# Patient Record
Sex: Female | Born: 1995 | Race: Black or African American | Hispanic: No | Marital: Single | Smoking: Never smoker
Health system: Southern US, Community
[De-identification: ages and names within clinical notes are randomized; demographics above are authoritative.]

## PROBLEM LIST (undated history)

## (undated) DIAGNOSIS — Z789 Other specified health status: Secondary | ICD-10-CM

## (undated) HISTORY — PX: NO PAST SURGERIES: SHX2092

---

## 2018-10-11 ENCOUNTER — Other Ambulatory Visit: Payer: Self-pay

## 2018-10-11 ENCOUNTER — Emergency Department (HOSPITAL_COMMUNITY): Payer: Self-pay

## 2018-10-11 ENCOUNTER — Emergency Department (HOSPITAL_COMMUNITY)
Admission: EM | Admit: 2018-10-11 | Discharge: 2018-10-11 | Disposition: A | Discharge status: Home / Self Care | Payer: Self-pay | Attending: Emergency Medicine | Admitting: Emergency Medicine

## 2018-10-11 DIAGNOSIS — M25512 Pain in left shoulder: Secondary | ICD-10-CM | POA: Insufficient documentation

## 2018-10-11 DIAGNOSIS — S9032XA Contusion of left foot, initial encounter: Secondary | ICD-10-CM

## 2018-10-11 DIAGNOSIS — M79672 Pain in left foot: Secondary | ICD-10-CM | POA: Insufficient documentation

## 2018-10-11 DIAGNOSIS — M25511 Pain in right shoulder: Secondary | ICD-10-CM | POA: Insufficient documentation

## 2018-10-11 MED ORDER — IBUPROFEN 800 MG PO TABS: 800.0000 mg | ORAL_TABLET | Freq: Three times a day (TID) | ORAL | 0 refills | Status: DC
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
## 2018-10-11 NOTE — ED Notes (Signed)
ED Provider at bedside. 
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
## 2018-10-11 NOTE — ED Notes (Signed)
Patient transported to X-ray 

## 2018-10-11 NOTE — ED Notes (Signed)
Patient verbalizes understanding of discharge instructions. Opportunity for questioning and answers were provided. Armband removed by staff, pt discharged from ED. Pt ambulatory to lobby.  

## 2018-10-11 NOTE — Discharge Instructions (Signed)
It was my pleasure taking care of you today!   Ibuprofen as needed for pain.  Rest / ice / elevate the foot to help with pain and swelling.   If your pain is not improving in 1 week, please call the orthopedist listed to schedule a follow up appointment.   Return to ER for new or worsening symptoms, any additional concerns.

## 2019-04-25 ENCOUNTER — Other Ambulatory Visit: Payer: Self-pay

## 2019-04-25 ENCOUNTER — Ambulatory Visit (HOSPITAL_COMMUNITY)
Admission: EM | Admit: 2019-04-25 | Discharge: 2019-04-25 | Disposition: A | Discharge status: Home / Self Care | Payer: BC Managed Care – PPO | Attending: Urgent Care | Admitting: Urgent Care

## 2019-04-25 ENCOUNTER — Encounter (HOSPITAL_COMMUNITY): Payer: Self-pay

## 2019-04-25 DIAGNOSIS — N760 Acute vaginitis: Secondary | ICD-10-CM | POA: Diagnosis not present

## 2019-04-25 DIAGNOSIS — N898 Other specified noninflammatory disorders of vagina: Secondary | ICD-10-CM | POA: Insufficient documentation

## 2019-04-25 MED ORDER — FLUCONAZOLE 150 MG PO TABS: 150.0000 mg | ORAL_TABLET | ORAL | 0 refills | Status: DC

## 2019-04-25 NOTE — ED Triage Notes (Signed)
Patient presents to Urgent Care with complaints of vaginal irritation since being treated for a yeast infection. Patient reports she thinks she may have developed BV, denies vaginal discharge.

## 2019-04-25 NOTE — ED Provider Notes (Signed)
  MRN: [id number] DOB: 01-19-1996  Subjective:   Tracy Browning is a 23 y.o. female presenting for 2 week hx of intermittent vaginal irritation/discharge. She thought it was a yeast infection and treated her sx with Monistat. It cleared up but after having her cycle (which ended 4 days ago) started having irritation. Does not have discharge. She did take 1 pill of amoxicillin from a left over script and did not notice any significant changes from this.  She is not currently taking any medications and has no known food or drug allergies.  Denies past medical and surgical history.  Review of Systems  Constitutional: Negative for chills, fever and malaise/fatigue.  Respiratory: Negative for cough and shortness of breath.   Cardiovascular: Negative for chest pain.  Gastrointestinal: Negative for abdominal pain, constipation, diarrhea, nausea and vomiting.  Genitourinary: Negative for dysuria, flank pain, frequency, hematuria and urgency.  Musculoskeletal: Negative for back pain and myalgias.  Skin: Negative for rash.  Neurological: Negative for dizziness and headaches.  Psychiatric/Behavioral: Negative for substance abuse.    Objective:   Vitals: BP 101/75 (BP Location: Left Arm)   Pulse 74   Temp 98.1 F (36.7 C) (Oral)   Resp 18   LMP 04/17/2019 (Exact Date)   SpO2 100%   Physical Exam Constitutional:      General: She is not in acute distress.    Appearance: Normal appearance. She is well-developed. She is not ill-appearing.  HENT:     Head: Normocephalic and atraumatic.     Nose: Nose normal.     Mouth/Throat:     Mouth: Mucous membranes are moist.     Pharynx: Oropharynx is clear.  Eyes:     General: No scleral icterus.    Extraocular Movements: Extraocular movements intact.     Pupils: Pupils are equal, round, and reactive to light.  Cardiovascular:     Rate and Rhythm: Normal rate.  Pulmonary:     Effort: Pulmonary effort is normal.  Skin:    General: Skin is warm and  dry.  Neurological:     General: No focal deficit present.     Mental Status: She is alert and oriented to person, place, and time.  Psychiatric:        Mood and Affect: Mood normal.        Behavior: Behavior normal.     Assessment and Plan :   1. Acute vaginitis   2. Vaginal irritation    We will treat empirically for yeast vaginitis with Diflucan, labs pending. Counseled patient on potential for adverse effects with medications prescribed/recommended today, ER and return-to-clinic precautions discussed, patient verbalized understanding.    Jaynee Eagles, PA-C 04/25/19 1214

## 2019-04-26 LAB — CERVICOVAGINAL ANCILLARY ONLY
Bacterial vaginitis: POSITIVE — AB
Candida vaginitis: NEGATIVE
Chlamydia: NEGATIVE
Neisseria Gonorrhea: NEGATIVE
Trichomonas: POSITIVE — AB

## 2019-04-27 ENCOUNTER — Telehealth (HOSPITAL_COMMUNITY): Payer: Self-pay | Admitting: Emergency Medicine

## 2019-04-27 MED ORDER — METRONIDAZOLE 500 MG PO TABS: 500.0000 mg | ORAL_TABLET | Freq: Two times a day (BID) | ORAL | 0 refills | Status: AC

## 2019-04-27 NOTE — Addendum Note (Signed)
Addended by: Sandria Manly on: 04/27/2019 02:47 PM   Modules accepted: Orders

## 2019-04-27 NOTE — Telephone Encounter (Signed)
Bacterial vaginosis is positive. This was not treated at the urgent care visit.  Flagyl 500 mg BID x 7 days #14 no refills sent to patients pharmacy of choice.    Patient contacted and made aware of all results, all questions answered.  Trichomonas is positive. Rx  for Flagyl, was sent to the pharmacy of record. Pt needs education to refrain from sexual intercourse for 7 days to give the medicine time to work. Sexual partners need to be notified and tested/treated. Condoms may reduce risk of reinfection. Recheck for further evaluation if symptoms are not improving.

## 2019-05-24 ENCOUNTER — Other Ambulatory Visit: Payer: Self-pay

## 2019-05-24 ENCOUNTER — Ambulatory Visit (HOSPITAL_COMMUNITY)
Admission: EM | Admit: 2019-05-24 | Discharge: 2019-05-24 | Disposition: A | Discharge status: Home / Self Care | Payer: BC Managed Care – PPO | Attending: Internal Medicine | Admitting: Internal Medicine

## 2019-05-24 ENCOUNTER — Encounter (HOSPITAL_COMMUNITY): Payer: Self-pay | Admitting: Emergency Medicine

## 2019-05-24 DIAGNOSIS — B373 Candidiasis of vulva and vagina: Secondary | ICD-10-CM

## 2019-05-24 DIAGNOSIS — B3731 Acute candidiasis of vulva and vagina: Secondary | ICD-10-CM

## 2019-05-24 MED ORDER — FLUCONAZOLE 150 MG PO TABS: 150.0000 mg | ORAL_TABLET | ORAL | 0 refills | Status: DC

## 2019-05-24 NOTE — ED Triage Notes (Signed)
PT reports vaginal discharge and itching that started today.

## 2019-05-24 NOTE — ED Provider Notes (Signed)
[facility]    CSN: [id number] Arrival date & time: 05/24/19  1929     History   Chief Complaint Chief Complaint  Patient presents with  . Vaginal Discharge    HPI Tracy Browning is a 23 y.o. female comes to urgent care with complaints of whitish vaginal discharge with itching of 1 day duration.  Symptoms started today.  No dysuria, urgency or frequency.  No nausea or vomiting.  No abdominal pain or cramping.  Patient is sexually active and has had unprotected sexual intercourse recently.  HPI  History reviewed. No pertinent past medical history.  There are no active problems to display for this patient.   History reviewed. No pertinent surgical history.  OB History   No obstetric history on file.      Home Medications    Prior to Admission medications   Medication Sig Start Date End Date Taking? Authorizing Provider  fluconazole (DIFLUCAN) 150 MG tablet Take 1 tablet (150 mg total) by mouth once a week. Repeat 72 hrs if no improvement. 05/24/19   Mozell Hardacre, Britta MccreedyPhilip O, MD  ibuprofen (ADVIL,MOTRIN) 800 MG tablet Take 1 tablet (800 mg total) by mouth 3 (three) times daily. 10/11/18   Ward, Chase PicketJaime Pilcher, PA-C    Family History Family History  Problem Relation Age of Onset  . Healthy Mother   . Healthy Father     Social History Social History   Tobacco Use  . Smoking status: Never Smoker  . Smokeless tobacco: Never Used  Substance Use Topics  . Alcohol use: Yes    Comment: socially  . Drug use: Yes    Types: Marijuana     Allergies   Patient has no known allergies.   Review of Systems Review of Systems  Constitutional: Negative.   Respiratory: Negative.   Gastrointestinal: Negative for abdominal pain, nausea and vomiting.  Genitourinary: Positive for vaginal discharge. Negative for dyspareunia, dysuria, frequency, genital sores, urgency and vaginal bleeding.  Musculoskeletal: Negative.   Skin: Negative.   Neurological: Negative.       Physical Exam Triage Vital Signs ED Triage Vitals  Enc Vitals Group     BP 05/24/19 2022 109/65     Pulse Rate 05/24/19 2022 82     Resp 05/24/19 2022 16     Temp 05/24/19 2022 98.2 F (36.8 C)     Temp Source 05/24/19 2022 Temporal     SpO2 05/24/19 2022 100 %     Weight --      Height --      Head Circumference --      Peak Flow --      Pain Score 05/24/19 2023 3     Pain Loc --      Pain Edu? --      Excl. in GC? --    No data found.  Updated Vital Signs BP 109/65 (BP Location: Left Arm)   Pulse 82   Temp 98.2 F (36.8 C) (Temporal)   Resp 16   LMP 05/19/2019   SpO2 100%   Visual Acuity Right Eye Distance:   Left Eye Distance:   Bilateral Distance:    Right Eye Near:   Left Eye Near:    Bilateral Near:     Physical Exam Vitals signs and nursing note reviewed.  Constitutional:      Appearance: Normal appearance.  Cardiovascular:     Rate and Rhythm: Normal rate and regular rhythm.     Pulses: Normal pulses.  Heart sounds: Normal heart sounds.  Pulmonary:     Effort: Pulmonary effort is normal.     Breath sounds: Normal breath sounds.  Abdominal:     General: Bowel sounds are normal.     Palpations: Abdomen is soft.  Musculoskeletal: Normal range of motion.  Skin:    Capillary Refill: Capillary refill takes less than 2 seconds.  Neurological:     General: No focal deficit present.     Mental Status: She is alert.      UC Treatments / Results  Labs (all labs ordered are listed, but only abnormal results are displayed) Labs Reviewed  CERVICOVAGINAL ANCILLARY ONLY    EKG   Radiology No results found.  Procedures Procedures (including critical care time)  Medications Ordered in UC Medications - No data to display  Initial Impression / Assessment and Plan / UC Course  I have reviewed the triage vital signs and the nursing notes.  Pertinent labs & imaging results that were available during my care of the patient were reviewed by me  and considered in my medical decision making (see chart for details).     1.  Vaginal discharge likely vaginal yeast infection: Fluconazole 150 mg now and to repeat after 72 hours if no improvement Patient is advised to return to urgent care if symptoms worsen STD screen Safe sex education was given Final Clinical Impressions(s) / UC Diagnoses   Final diagnoses:  Vaginal yeast infection   Discharge Instructions   None    ED Prescriptions    Medication Sig Dispense Auth. Provider   fluconazole (DIFLUCAN) 150 MG tablet Take 1 tablet (150 mg total) by mouth once a week. Repeat 72 hrs if no improvement. 2 tablet Olegario Emberson, Myrene Galas, MD     Controlled Substance Prescriptions [place] Controlled Substance Registry consulted? No   Chase Picket, MD 05/25/19 [phone]

## 2019-05-26 LAB — CERVICOVAGINAL ANCILLARY ONLY
Bacterial vaginitis: NEGATIVE
Candida vaginitis: POSITIVE — AB
Chlamydia: NEGATIVE
Neisseria Gonorrhea: NEGATIVE
Trichomonas: NEGATIVE

## 2019-06-27 ENCOUNTER — Other Ambulatory Visit: Payer: Self-pay

## 2019-06-27 ENCOUNTER — Ambulatory Visit (HOSPITAL_COMMUNITY)
Admission: EM | Admit: 2019-06-27 | Discharge: 2019-06-27 | Disposition: A | Discharge status: Home / Self Care | Payer: BC Managed Care – PPO | Attending: Family Medicine | Admitting: Family Medicine

## 2019-06-27 ENCOUNTER — Encounter (HOSPITAL_COMMUNITY): Payer: Self-pay

## 2019-06-27 DIAGNOSIS — N760 Acute vaginitis: Secondary | ICD-10-CM | POA: Diagnosis not present

## 2019-06-27 MED ORDER — METRONIDAZOLE 500 MG PO TABS: 500.0000 mg | ORAL_TABLET | Freq: Two times a day (BID) | ORAL | 0 refills | Status: DC

## 2019-06-27 MED ORDER — FLUCONAZOLE 150 MG PO TABS: 150.0000 mg | ORAL_TABLET | Freq: Every day | ORAL | 0 refills | Status: DC

## 2019-06-27 NOTE — ED Triage Notes (Signed)
Pt presents with what she describes as a yeast build up and vaginal itching for over a month.

## 2019-06-27 NOTE — ED Provider Notes (Signed)
[facility]    CSN: [id number] Arrival date & time: 06/27/19  1006      History   Chief Complaint Chief Complaint  Patient presents with  . Vaginitis    HPI Tracy Browning is a 23 y.o. female.   HPI Patient has history of BV and yeast infections.  She thinks she has both right now.  She would like treatment.  She is also like testing for STDs.  She has no abdominal pain.  No fever.  No irregular vaginal bleeding.  Moderate vaginal discharge.  She feels she has a "gas buildup".  No dysuria.  No frequency History reviewed. No pertinent past medical history.  There are no active problems to display for this patient.   History reviewed. No pertinent surgical history.  OB History   No obstetric history on file.      Home Medications    Prior to Admission medications   Medication Sig Start Date End Date Taking? Authorizing Provider  fluconazole (DIFLUCAN) 150 MG tablet Take 1 tablet (150 mg total) by mouth daily. Repeat in 1 week if needed 06/27/19   Eustace MooreNelson, Yvonne Sue, MD  ibuprofen (ADVIL,MOTRIN) 800 MG tablet Take 1 tablet (800 mg total) by mouth 3 (three) times daily. 10/11/18   Ward, Chase PicketJaime Pilcher, PA-C  metroNIDAZOLE (FLAGYL) 500 MG tablet Take 1 tablet (500 mg total) by mouth 2 (two) times daily. 06/27/19   Eustace MooreNelson, Yvonne Sue, MD    Family History Family History  Problem Relation Age of Onset  . Healthy Mother   . Healthy Father     Social History Social History   Tobacco Use  . Smoking status: Never Smoker  . Smokeless tobacco: Never Used  Substance Use Topics  . Alcohol use: Yes    Comment: socially  . Drug use: Yes    Types: Marijuana     Allergies   Patient has no known allergies.   Review of Systems Review of Systems  Constitutional: Negative for chills and fever.  HENT: Negative for ear pain and sore throat.   Eyes: Negative for pain and visual disturbance.  Respiratory: Negative for cough and shortness of breath.   Cardiovascular:  Negative for chest pain and palpitations.  Gastrointestinal: Negative for abdominal pain and vomiting.  Genitourinary: Positive for vaginal discharge. Negative for dysuria and hematuria.  Musculoskeletal: Negative for arthralgias and back pain.  Skin: Negative for color change and rash.  Neurological: Negative for seizures and syncope.  All other systems reviewed and are negative.    Physical Exam Triage Vital Signs ED Triage Vitals  Enc Vitals Group     BP 06/27/19 1037 105/65     Pulse Rate 06/27/19 1037 70     Resp 06/27/19 1037 18     Temp 06/27/19 1037 98.1 F (36.7 C)     Temp Source 06/27/19 1037 Oral     SpO2 06/27/19 1037 100 %     Weight --      Height --      Head Circumference --      Peak Flow --      Pain Score 06/27/19 1038 0     Pain Loc --      Pain Edu? --      Excl. in GC? --    No data found.  Updated Vital Signs BP 105/65 (BP Location: Left Arm)   Pulse 70   Temp 98.1 F (36.7 C) (Oral)   Resp 18   LMP 06/12/2019  SpO2 100%       Physical Exam Constitutional:      General: She is not in acute distress.    Appearance: She is well-developed.  HENT:     Head: Normocephalic and atraumatic.  Eyes:     Conjunctiva/sclera: Conjunctivae normal.     Pupils: Pupils are equal, round, and reactive to light.  Neck:     Musculoskeletal: Normal range of motion.  Cardiovascular:     Rate and Rhythm: Normal rate.  Pulmonary:     Effort: Pulmonary effort is normal. No respiratory distress.  Abdominal:     General: There is no distension.     Palpations: Abdomen is soft.  Genitourinary:    Comments: Declined Musculoskeletal: Normal range of motion.  Skin:    General: Skin is warm and dry.  Neurological:     Mental Status: She is alert.      UC Treatments / Results  Labs (all labs ordered are listed, but only abnormal results are displayed) Labs Reviewed  CERVICOVAGINAL ANCILLARY ONLY    EKG   Radiology No results found.   Procedures Procedures (including critical care time)  Medications Ordered in UC Medications - No data to display  Initial Impression / Assessment and Plan / UC Course  I have reviewed the triage vital signs and the nursing notes.  Pertinent labs & imaging results that were available during my care of the patient were reviewed by me and considered in my medical decision making (see chart for details).     *We will treat for the vaginal infection symptoms.  Blood test for STDs.  Will contact patient if any of her tests are positive.  Discussed prevention Final Clinical Impressions(s) / UC Diagnoses   Final diagnoses:  Acute vaginitis     Discharge Instructions     Take Diflucan today.  Take Diflucan in 1 week at the end of your metronidazole Take metronidazole twice a day for 7 days.  Do not drink alcohol while you are on the metronidazole This will treat both bacterial vaginosis and yeast infections Consider taking a probiotic for women every day.  This will help prevent vaginal infections.  An alternative is to eat a a [place] daily.   ED Prescriptions    Medication Sig Dispense Auth. Provider   metroNIDAZOLE (FLAGYL) 500 MG tablet Take 1 tablet (500 mg total) by mouth 2 (two) times daily. 14 tablet Raylene Everts, MD   fluconazole (DIFLUCAN) 150 MG tablet Take 1 tablet (150 mg total) by mouth daily. Repeat in 1 week if needed 2 tablet Raylene Everts, MD     Controlled Substance Prescriptions [place] Controlled Substance Registry consulted? Not Applicable   Raylene Everts, MD 06/27/19 1527

## 2019-06-27 NOTE — Discharge Instructions (Signed)
Take Diflucan today.  Take Diflucan in 1 week at the end of your metronidazole Take metronidazole twice a day for 7 days.  Do not drink alcohol while you are on the metronidazole This will treat both bacterial vaginosis and yeast infections Consider taking a probiotic for women every day.  This will help prevent vaginal infections.  An alternative is to eat a a [place] daily.

## 2019-06-28 LAB — CERVICOVAGINAL ANCILLARY ONLY
Chlamydia: NEGATIVE
Neisseria Gonorrhea: NEGATIVE
Trichomonas: NEGATIVE

## 2020-08-07 ENCOUNTER — Other Ambulatory Visit: Payer: Self-pay

## 2020-08-07 ENCOUNTER — Emergency Department (HOSPITAL_COMMUNITY)
Admission: EM | Admit: 2020-08-07 | Discharge: 2020-08-07 | Disposition: A | Discharge status: Home / Self Care | Payer: BC Managed Care – PPO | Attending: Emergency Medicine | Admitting: Emergency Medicine

## 2020-08-07 ENCOUNTER — Encounter (HOSPITAL_COMMUNITY): Payer: Self-pay | Admitting: Emergency Medicine

## 2020-08-07 ENCOUNTER — Emergency Department (HOSPITAL_COMMUNITY): Payer: BC Managed Care – PPO

## 2020-08-07 DIAGNOSIS — S161XXA Strain of muscle, fascia and tendon at neck level, initial encounter: Secondary | ICD-10-CM | POA: Diagnosis not present

## 2020-08-07 DIAGNOSIS — S3992XA Unspecified injury of lower back, initial encounter: Secondary | ICD-10-CM | POA: Diagnosis present

## 2020-08-07 DIAGNOSIS — S39012A Strain of muscle, fascia and tendon of lower back, initial encounter: Secondary | ICD-10-CM | POA: Diagnosis not present

## 2020-08-07 LAB — PREGNANCY, URINE: Preg Test, Ur: NEGATIVE

## 2020-08-07 MED ORDER — IBUPROFEN 800 MG PO TABS
800.0000 mg | ORAL_TABLET | Freq: Once | ORAL | Status: AC
  Administered 2020-08-07: 800 mg via ORAL
  Filled 2020-08-07: qty 1

## 2020-08-07 MED ORDER — CYCLOBENZAPRINE HCL 5 MG PO TABS: 5.0000 mg | ORAL_TABLET | Freq: Three times a day (TID) | ORAL | 0 refills | Status: DC | PRN

## 2020-08-07 MED ORDER — IBUPROFEN 800 MG PO TABS: 800.0000 mg | ORAL_TABLET | Freq: Three times a day (TID) | ORAL | 0 refills | Status: DC
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
## 2021-04-30 ENCOUNTER — Emergency Department (HOSPITAL_COMMUNITY)
Admission: EM | Admit: 2021-04-30 | Discharge: 2021-05-01 | Disposition: A | Discharge status: Home / Self Care | Payer: BC Managed Care – PPO | Attending: Emergency Medicine | Admitting: Emergency Medicine

## 2021-04-30 ENCOUNTER — Other Ambulatory Visit: Payer: Self-pay

## 2021-04-30 DIAGNOSIS — M549 Dorsalgia, unspecified: Secondary | ICD-10-CM | POA: Insufficient documentation

## 2021-04-30 DIAGNOSIS — Z5321 Procedure and treatment not carried out due to patient leaving prior to being seen by health care provider: Secondary | ICD-10-CM | POA: Diagnosis not present

## 2021-04-30 NOTE — ED Triage Notes (Signed)
C/o back pain
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
## 2021-05-01 ENCOUNTER — Other Ambulatory Visit: Payer: Self-pay

## 2021-05-01 ENCOUNTER — Emergency Department
Admission: EM | Admit: 2021-05-01 | Discharge: 2021-05-01 | Disposition: A | Discharge status: Home / Self Care | Payer: BC Managed Care – PPO | Attending: Emergency Medicine | Admitting: Emergency Medicine

## 2021-05-01 ENCOUNTER — Encounter: Payer: Self-pay | Admitting: *Deleted

## 2021-05-01 DIAGNOSIS — M546 Pain in thoracic spine: Secondary | ICD-10-CM | POA: Diagnosis not present

## 2021-05-01 DIAGNOSIS — M545 Low back pain, unspecified: Secondary | ICD-10-CM | POA: Diagnosis not present

## 2021-05-01 DIAGNOSIS — M5459 Other low back pain: Secondary | ICD-10-CM

## 2021-05-01 MED ORDER — TIZANIDINE HCL 4 MG PO TABS: 4.0000 mg | ORAL_TABLET | Freq: Three times a day (TID) | ORAL | 0 refills | Status: AC

## 2021-05-01 MED ORDER — MELOXICAM 15 MG PO TABS: 15.0000 mg | ORAL_TABLET | Freq: Every day | ORAL | 0 refills | Status: AC

## 2021-05-01 NOTE — ED Notes (Signed)
Pt stated she was leaving due to the wait time. Moving pt OTF.
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
## 2021-05-01 NOTE — Discharge Instructions (Addendum)
Please take meloxicam and tizanidine daily as needed.  Continue with heating pad.  Avoid physical activity that reproduces back pain.  Keep follow-up appointment with orthopedist.  Return to the ER for any worsening symptoms or urgent changes in health
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
## 2021-06-06 ENCOUNTER — Other Ambulatory Visit: Payer: Self-pay | Admitting: Orthopedic Surgery

## 2021-06-09 ENCOUNTER — Other Ambulatory Visit: Payer: Self-pay | Admitting: Orthopedic Surgery

## 2021-06-09 DIAGNOSIS — M542 Cervicalgia: Secondary | ICD-10-CM

## 2021-06-20 ENCOUNTER — Other Ambulatory Visit: Payer: Self-pay

## 2021-06-20 ENCOUNTER — Ambulatory Visit
Admission: RE | Admit: 2021-06-20 | Discharge: 2021-06-20 | Disposition: A | Discharge status: Home / Self Care | Payer: Worker's Compensation | Source: Ambulatory Visit | Attending: Orthopedic Surgery | Admitting: Orthopedic Surgery

## 2021-06-20 DIAGNOSIS — M542 Cervicalgia: Secondary | ICD-10-CM

## 2021-06-20 MED ORDER — GADOBENATE DIMEGLUMINE 529 MG/ML IV SOLN
12.0000 mL | Freq: Once | INTRAVENOUS | Status: AC | PRN
  Administered 2021-06-20: 12 mL via INTRAVENOUS
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
## 2022-07-06 LAB — OB RESULTS CONSOLE HIV ANTIBODY (ROUTINE TESTING): HIV: NONREACTIVE

## 2022-07-06 LAB — OB RESULTS CONSOLE HEPATITIS B SURFACE ANTIGEN: Hepatitis B Surface Ag: NEGATIVE

## 2022-07-06 LAB — OB RESULTS CONSOLE RUBELLA ANTIBODY, IGM: Rubella: IMMUNE

## 2022-07-06 LAB — HEPATITIS C ANTIBODY: HCV Ab: NEGATIVE

## 2022-07-22 ENCOUNTER — Other Ambulatory Visit: Payer: Self-pay | Admitting: Obstetrics and Gynecology

## 2022-07-22 ENCOUNTER — Other Ambulatory Visit (HOSPITAL_COMMUNITY)
Admission: RE | Admit: 2022-07-22 | Discharge: 2022-07-22 | Disposition: A | Discharge status: Home / Self Care | Payer: BC Managed Care – PPO | Source: Ambulatory Visit | Attending: Obstetrics and Gynecology | Admitting: Obstetrics and Gynecology

## 2022-07-22 DIAGNOSIS — Z3490 Encounter for supervision of normal pregnancy, unspecified, unspecified trimester: Secondary | ICD-10-CM | POA: Diagnosis present

## 2022-07-22 DIAGNOSIS — Z3A10 10 weeks gestation of pregnancy: Secondary | ICD-10-CM | POA: Diagnosis not present

## 2022-07-22 DIAGNOSIS — O282 Abnormal cytological finding on antenatal screening of mother: Secondary | ICD-10-CM | POA: Insufficient documentation

## 2022-07-22 LAB — OB RESULTS CONSOLE GC/CHLAMYDIA
Chlamydia: NEGATIVE
Neisseria Gonorrhea: NEGATIVE

## 2022-07-29 LAB — CYTOLOGY - PAP
Comment: NEGATIVE
High risk HPV: POSITIVE — AB

## 2022-10-26 NOTE — L&D Delivery Note (Signed)
Delivery Note    Patient Name: Tracy Browning DOB: 23-Jul-1996 MRN: [id number]  Date of admission: 02/15/2023 Delivering MD: Dale [place]  Date of delivery: 02/16/2023 Type of delivery: SVD  Newborn Data: Live born female  Birth Weight:   APGAR: 9, 9  Newborn Delivery   Birth date/time: 02/16/2023 06:15:00 Delivery type: Vaginal, Spontaneous     [name], 27 y.o., @ [redacted]w[redacted]d,  G1P0, who was admitted for spontaneous labor. I was called to the room when she progressed 2+ station in the second stage of labor with BBOW, bag broke during pushing with clear fluids.  She pushed for 10/min.  She delivered a viable infant, cephalic and restituted to the ROA position over an intact perineum.  A nuchal cord   was not identified. The baby was placed on maternal abdomen while initial step of NRP were perfmored (Dry, Stimulated, and warmed). Hat placed on baby for thermoregulation. Delayed cord clamping was performed for 3 minutes.  Cord double clamped and cut.  Cord cut by FOB. Apgar scores were 9 and 9. Prophylactic Pitocin was started in the third stage of labor for active management. The placenta delivered spontaneously, shultz, with a 3 vessel cord with marginal cord insertion and was sent to LD.  Inspection revealed none. An examination of the vaginal vault and cervix was free from lacerations. The uterus was firm, bleeding stable.   Placenta and umbilical artery blood gas were not sent.  There were no complications during the procedure.  Mom and baby skin to skin following delivery. Left in stable condition.  Maternal Info: Anesthesia: Epidural Episiotomy: no Lacerations:  no Suture Repair: no Est. Blood Loss (mL):   Newborn Info:  Baby Sex: Female Babies Name: Nomi APGAR (1 MIN): 9   APGAR (5 MINS): 9   APGAR (10 MINS):     Mom to postpartum.  Baby to Couplet care / Skin to Skin.  Delivery Report:    Review the Delivery Report for details.   [facility] CNM, FNP-C, PMHNP-BC   [id number]  [place], [place] [address]  Cell: [phone]  Office Phone: [phone] Fax: [phone] 02/16/2023  6:50 AM

## 2022-11-17 LAB — OB RESULTS CONSOLE RPR: RPR: NONREACTIVE

## 2022-12-15 ENCOUNTER — Ambulatory Visit: Payer: BC Managed Care – PPO | Attending: Cardiology | Admitting: Cardiology

## 2022-12-15 ENCOUNTER — Encounter: Payer: Self-pay | Admitting: Cardiology

## 2022-12-15 VITALS — BP 104/70 | HR 87 | Ht 62.0 in | Wt 141.8 lb

## 2022-12-15 DIAGNOSIS — R55 Syncope and collapse: Secondary | ICD-10-CM

## 2022-12-15 DIAGNOSIS — Z3A30 30 weeks gestation of pregnancy: Secondary | ICD-10-CM

## 2022-12-15 DIAGNOSIS — R0609 Other forms of dyspnea: Secondary | ICD-10-CM | POA: Diagnosis not present

## 2022-12-15 NOTE — Patient Instructions (Signed)
Medication Instructions:  Your physician recommends that you continue on your current medications as directed. Please refer to the Current Medication list given to you today.  *If you need a refill on your cardiac medications before your next appointment, please call your pharmacy*   Lab Work: None   Testing/Procedures: Your physician has requested that you have an echocardiogram. Echocardiography is a painless test that uses sound waves to create images of your heart. It provides your doctor with information about the size and shape of your heart and how well your heart's chambers and valves are working. This procedure takes approximately one hour. There are no restrictions for this procedure. Please do NOT wear cologne, perfume, aftershave, or lotions (deodorant is allowed). Please arrive 15 minutes prior to your appointment time.    Follow-Up: At [facility], you and your health needs are our priority.  As part of our continuing mission to provide you with exceptional heart care, we have created designated Provider Care Teams.  These Care Teams include your primary Cardiologist (physician) and Advanced Practice Providers (APPs -  Physician Assistants and Nurse Practitioners) who all work together to provide you with the care you need, when you need it.  We recommend signing up for the patient portal called "MyChart".  Sign up information is provided on this After Visit Summary.  MyChart is used to connect with patients for Virtual Visits (Telemedicine).  Patients are able to view lab/test results, encounter notes, upcoming appointments, etc.  Non-urgent messages can be sent to your provider as well.   To learn more about what you can do with MyChart, go to [email or web address].    Your next appointment:   12 week(s)  Provider:   Berniece Salines, DO

## 2022-12-16 NOTE — Progress Notes (Signed)
[facility]  New Evaluation  Date:  12/16/2022   ID:  Tracy Browning, DOB 31-Jan-1996, MRN [id number]  PCP:  Christophe Louis, [facility] Cardiologist:  Berniece Salines, DO  Electrophysiologist:  None       Referring MD: Christophe Louis, MD   Chief Complaint: " I have had 2 passing out spell after a bowel movement"  History of Present Illness:    Tracy Browning is a 27 y.o. female [G1P0] who is being seen today for the evaluation of symocpe at the request of Christophe Louis, MD.   She has no reported medical history. She tells me that she has had two episodes of passing out after having a bowel movement - one in dec 2023 and the other in Jan 2023.   No palpitations during these events. No confusion. She tells me that she is only out for a very short time when these events occur.  She admits of shortness of breath on exertion recently - which had progressed now she is short of breath at rest.   Prior CV Studies Reviewed: The following studies were reviewed today:   No past medical history on file.  No past surgical history on file.    OB History     Gravida  1   Para      Term      Preterm      AB      Living         SAB      IAB      Ectopic      Multiple      Live Births                  Current Medications: Current Meds  Medication Sig   Ferrous Sulfate (IRON PO) Take by mouth daily.   Prenatal Vit-Fe Fumarate-FA (PRENATAL PO) Take by mouth daily.     Allergies:   Patient has no known allergies.   Social History   Socioeconomic History   Marital status: Single    Spouse name: Not on file   Number of children: Not on file   Years of education: Not on file   Highest education level: Not on file  Occupational History   Not on file  Tobacco Use   Smoking status: Never   Smokeless tobacco: Never  Vaping Use   Vaping Use: Never used  Substance and Sexual Activity   Alcohol use: Yes    Comment: socially   Drug use:  Yes    Types: Marijuana   Sexual activity: Not on file  Other Topics Concern   Not on file  Social History Narrative   Not on file   Social Determinants of Health   Financial Resource Strain: Not on file  Food Insecurity: Not on file  Transportation Needs: Not on file  Physical Activity: Not on file  Stress: Not on file  Social Connections: Not on file      Family History  Problem Relation Age of Onset   Healthy Mother    Healthy Father       ROS:   Please see the history of present illness.    Passing out, shortness of breath All other systems reviewed and are negative.   Labs/EKG Reviewed:    EKG:   EKG is was ordered today.  The ekg ordered today demonstrates NSR 87 bpm  Recent Labs: No results found for requested labs within last 365 days.  Recent Lipid Panel No results found for: "CHOL", "TRIG", "HDL", "CHOLHDL", "LDLCALC", "LDLDIRECT"  Physical Exam:    VS:  BP 104/70   Pulse 87   Ht 5' 2"$  (1.575 m)   Wt 64.3 kg   LMP 05/05/2022   SpO2 100%   BMI 25.94 kg/m     Wt Readings from Last 3 Encounters:  12/15/22 64.3 kg  05/01/21 59 kg  04/30/21 59 kg     GEN:  Well nourished, well developed in no acute distress HEENT: Normal NECK: No JVD; No carotid bruits LYMPHATICS: No lymphadenopathy CARDIAC: RRR, no murmurs, rubs, gallops RESPIRATORY:  Clear to auscultation without rales, wheezing or rhonchi  ABDOMEN: Soft, non-tender, non-distended MUSCULOSKELETAL:  No edema; No deformity  SKIN: Warm and dry NEUROLOGIC:  Alert and oriented x 3 PSYCHIATRIC:  Normal affect    Risk Assessment/Risk Calculators:     CARPREG II Risk Prediction Index Score:  1.  The patient's risk for a primary cardiac event is 5%.   Modified World Health Organization [facility]) Classification of Maternal CV Risk   Class I         ASSESSMENT & PLAN:    Syncope  Shortness of breath   I suspect that her passing out is vasovagal syncope given the clinical setting.  Most  concern about her progressive at rest shortness of breath- will get an echo to assess LV function and other structural abnormality.  Thankfully no signs of fluid overload.   Will continue to monitor.   Fu in 12 week or sooner if needed.   Patient Instructions  Medication Instructions:  Your physician recommends that you continue on your current medications as directed. Please refer to the Current Medication list given to you today.  *If you need a refill on your cardiac medications before your next appointment, please call your pharmacy*   Lab Work: None   Testing/Procedures: Your physician has requested that you have an echocardiogram. Echocardiography is a painless test that uses sound waves to create images of your heart. It provides your doctor with information about the size and shape of your heart and how well your heart's chambers and valves are working. This procedure takes approximately one hour. There are no restrictions for this procedure. Please do NOT wear cologne, perfume, aftershave, or lotions (deodorant is allowed). Please arrive 15 minutes prior to your appointment time.    Follow-Up: At [facility], you and your health needs are our priority.  As part of our continuing mission to provide you with exceptional heart care, we have created designated Provider Care Teams.  These Care Teams include your primary Cardiologist (physician) and Advanced Practice Providers (APPs -  Physician Assistants and Nurse Practitioners) who all work together to provide you with the care you need, when you need it.  We recommend signing up for the patient portal called "MyChart".  Sign up information is provided on this After Visit Summary.  MyChart is used to connect with patients for Virtual Visits (Telemedicine).  Patients are able to view lab/test results, encounter notes, upcoming appointments, etc.  Non-urgent messages can be sent to your provider as well.   To learn more  about what you can do with MyChart, go to [email or web address].    Your next appointment:   12 week(s)  Provider:   Berniece Salines, DO     Dispo:  No follow-ups on file.   Medication Adjustments/Labs and Tests Ordered: Current medicines are reviewed at length with the patient today.  Concerns regarding medicines are outlined above.  Tests Ordered: Orders Placed This Encounter  Procedures   EKG 12-Lead   ECHOCARDIOGRAM COMPLETE   Medication Changes: No orders of the defined types were placed in this encounter.

## 2023-01-14 ENCOUNTER — Ambulatory Visit (HOSPITAL_COMMUNITY): Payer: BC Managed Care – PPO

## 2023-02-09 ENCOUNTER — Ambulatory Visit (HOSPITAL_COMMUNITY): Payer: BC Managed Care – PPO | Attending: Internal Medicine

## 2023-02-09 DIAGNOSIS — R0609 Other forms of dyspnea: Secondary | ICD-10-CM | POA: Diagnosis present

## 2023-02-09 LAB — ECHOCARDIOGRAM COMPLETE
Area-P 1/2: 3.88 cm2
S' Lateral: 2.5 cm

## 2023-02-15 ENCOUNTER — Other Ambulatory Visit: Payer: Self-pay

## 2023-02-15 ENCOUNTER — Inpatient Hospital Stay (EMERGENCY_DEPARTMENT_HOSPITAL)
Admission: AD | Admit: 2023-02-15 | Discharge: 2023-02-15 | Disposition: A | Discharge status: Home / Self Care | Payer: BC Managed Care – PPO | Source: Home / Self Care | Attending: Obstetrics and Gynecology | Admitting: Obstetrics and Gynecology

## 2023-02-15 ENCOUNTER — Inpatient Hospital Stay (HOSPITAL_COMMUNITY)
Admission: AD | Admit: 2023-02-15 | Discharge: 2023-02-18 | DRG: 807 | Disposition: A | Discharge status: Home / Self Care | Payer: BC Managed Care – PPO | Attending: Obstetrics and Gynecology | Admitting: Obstetrics and Gynecology

## 2023-02-15 ENCOUNTER — Encounter (HOSPITAL_COMMUNITY): Payer: Self-pay | Admitting: Obstetrics and Gynecology

## 2023-02-15 DIAGNOSIS — Z3A39 39 weeks gestation of pregnancy: Secondary | ICD-10-CM | POA: Insufficient documentation

## 2023-02-15 DIAGNOSIS — O43123 Velamentous insertion of umbilical cord, third trimester: Principal | ICD-10-CM | POA: Diagnosis present

## 2023-02-15 DIAGNOSIS — O471 False labor at or after 37 completed weeks of gestation: Secondary | ICD-10-CM

## 2023-02-15 DIAGNOSIS — O99013 Anemia complicating pregnancy, third trimester: Secondary | ICD-10-CM | POA: Diagnosis not present

## 2023-02-15 HISTORY — DX: Other specified health status: Z78.9

## 2023-02-15 NOTE — MAU Note (Signed)
..  Tracy Browning is a 27 y.o. at [redacted]w[redacted]d here in MAU reporting:   Contractions every: 5 minutes Onset of ctx: Today Pain score: 5/10  ROM: Intact Vaginal Bleeding: Scant 3:30 am blood on tissue.  Last SVE: 0   Fetal Movement: Reports positive FM FHT:140 via External  Vitals:   02/15/23 0446  BP: 108/62  Pulse: 73  Resp: 17  Temp: 98.3 F (36.8 C)  SpO2: 100%       OB Office: [facility] GBS: Negative HSV: Denies hx of HSV Lab orders placed from triage: MAU Labor Eval

## 2023-02-15 NOTE — MAU Note (Addendum)
.  Tracy Browning is a 27 y.o. at [redacted]w[redacted]d here in MAU reporting: Pt reports ctx's since yesterday worsened at 1500. Onset of complaint: yesterday  Pain score: 8/10 There were no vitals filed for this visit.   +FM Denies LOF, reports bloody show  Lab orders placed from triage:   none

## 2023-02-15 NOTE — MAU Provider Note (Signed)
Nurse labor check      S: Ms. Cadience Bradfield is a 27 y.o. G1P0 at [redacted]w[redacted]d  who presents to MAU today complaining contractions q 5 minutes since 3:30am. She had a small amount of bloody show. She denies LOF. She reports normal fetal movement.    O: BP 110/67   Pulse 78   Temp 98.3 F (36.8 C) (Oral)   Resp 17   Ht  (1.575 m)   Wt 68.6 kg   LMP 05/05/2022   SpO2 100%   BMI 27.67 kg/m  Exam by nursing staff.  Cervical exam:  Dilation: 3.5 Effacement (%): 90 Cervical Position: Posterior Station: -2 Presentation: Vertex Exam by:: Holly Flippin RN   Fetal Monitoring: Baseline: 130 Variability: moderate Accelerations: present Decelerations: none Contractions: q5-7   A: SIUP at [redacted]w[redacted]d  False labor  P: Discharge to home.  Levie Heritage, DO 02/15/2023 9:16 AM

## 2023-02-15 NOTE — Discharge Instructions (Signed)
The [employer] This circuit takes at least 90 minutes to complete so clear your schedule and make mental preparations so you can relax in your environment.  The second step requires a lot of pillows so gather them up before beginning.  Before starting, you should empty your bladder! Have a nice drink nearby, and make sure it has a straw! If you are having contractions, this circuit should be done through contractions, try not to change positions between steps.  Step One: Open-kneeChest Stay in this position for 30 minutes, start in cat/cow, then drop your chest as low as you can to the bed or the floor and your bottom as high as you can. Knees should be fairly wide apart, and the angle between the torso/thighs should be wider than 90 degrees. Wiggle around, prop with lots of pillows and use this time to get totally relaxed. This position allows the baby to scoot out of the pelvis a bit and gives them room to rotate, shift their head position, etc. If the pregnant person finds it helpful, careful positioning with a rebozo under the belly, with gentle tension from a support person behind can help maintain this position for the full 30 minutes.  Step Two:ExaggeratedLeft SideLying Roll to your left side, bringing your top leg as high as possible and keeping your bottom leg straight. Roll forward as much as possible, again using a lot of pillows. Sink into the bed and relax some more. If you fall asleep, that's totally okay and you can stay there! If not, stay here for at least another half an hour. Try and get your top right leg up towards your head and get as rolled over onto your belly as much as possible. If you repeat the circuit during labor, try alternating left and right sides. We know the photo the left is actually right side... just flip the image in your head.  Step Three: Moving and Lunges Lunge, walk stairs facing sideways, 2 at a time, (have a spotter  downstairs of you!), take a walk outside with one foot on the curb and the other on the street, sit on a birth ball and hula- anything that's upright and putting your pelvis in open, asymmetrical positions. Spend at least 30 minutes doing this one as well to give your baby a chance to move down. If you are lunging or stair or curb walking, you should lunge/walk/go up stairs in the direction that feels better to you. The key with the lunge is that the toes of the higher leg and mom's belly button should be at right angles. Do not lunge over your knee, that closes the pelvis.  You got this!!!!!!!!!!!!!!!!!!!!!!! :)  

## 2023-02-16 ENCOUNTER — Inpatient Hospital Stay (HOSPITAL_COMMUNITY): Payer: BC Managed Care – PPO | Admitting: Anesthesiology

## 2023-02-16 ENCOUNTER — Encounter (HOSPITAL_COMMUNITY): Payer: Self-pay

## 2023-02-16 DIAGNOSIS — Z3A39 39 weeks gestation of pregnancy: Secondary | ICD-10-CM

## 2023-02-16 DIAGNOSIS — O43123 Velamentous insertion of umbilical cord, third trimester: Secondary | ICD-10-CM | POA: Diagnosis present

## 2023-02-16 DIAGNOSIS — O99013 Anemia complicating pregnancy, third trimester: Secondary | ICD-10-CM | POA: Diagnosis not present

## 2023-02-16 DIAGNOSIS — O26893 Other specified pregnancy related conditions, third trimester: Secondary | ICD-10-CM | POA: Diagnosis present

## 2023-02-16 LAB — CBC
HCT: 35.6 % — ABNORMAL LOW (ref 36.0–46.0)
Hemoglobin: 12.3 g/dL (ref 12.0–15.0)
MCH: 30.4 pg (ref 26.0–34.0)
MCHC: 34.6 g/dL (ref 30.0–36.0)
MCV: 87.9 fL (ref 80.0–100.0)
Platelets: 205 10*3/uL (ref 150–400)
RBC: 4.05 MIL/uL (ref 3.87–5.11)
RDW: 15.8 % — ABNORMAL HIGH (ref 11.5–15.5)
WBC: 8.1 10*3/uL (ref 4.0–10.5)
nRBC: 0 % (ref 0.0–0.2)

## 2023-02-16 LAB — OB RESULTS CONSOLE ABO/RH: RH Type: POSITIVE

## 2023-02-16 LAB — TYPE AND SCREEN
ABO/RH(D): A POS
Antibody Screen: NEGATIVE

## 2023-02-16 LAB — RPR: RPR Ser Ql: NONREACTIVE

## 2023-02-16 MED ORDER — OXYCODONE-ACETAMINOPHEN 5-325 MG PO TABS: 2.0000 | ORAL_TABLET | ORAL | Status: DC | PRN

## 2023-02-16 MED ORDER — ONDANSETRON HCL 4 MG PO TABS: 4.0000 mg | ORAL_TABLET | ORAL | Status: DC | PRN

## 2023-02-16 MED ORDER — BENZOCAINE-MENTHOL 20-0.5 % EX AERO: 1.0000 | INHALATION_SPRAY | CUTANEOUS | Status: DC | PRN

## 2023-02-16 MED ORDER — IBUPROFEN 600 MG PO TABS
600.0000 mg | ORAL_TABLET | Freq: Four times a day (QID) | ORAL | Status: DC
  Administered 2023-02-16 – 2023-02-18 (×10): 600 mg via ORAL
  Filled 2023-02-16 (×10): qty 1

## 2023-02-16 MED ORDER — FENTANYL CITRATE (PF) 100 MCG/2ML IJ SOLN: 50.0000 ug | INTRAMUSCULAR | Status: DC | PRN

## 2023-02-16 MED ORDER — EPHEDRINE 5 MG/ML INJ: 10.0000 mg | INTRAVENOUS | Status: DC | PRN

## 2023-02-16 MED ORDER — ACETAMINOPHEN 325 MG PO TABS: 650.0000 mg | ORAL_TABLET | ORAL | Status: DC | PRN

## 2023-02-16 MED ORDER — SOD CITRATE-CITRIC ACID 500-334 MG/5ML PO SOLN: 30.0000 mL | ORAL | Status: DC | PRN

## 2023-02-16 MED ORDER — FLEET ENEMA 7-19 GM/118ML RE ENEM: 1.0000 | ENEMA | RECTAL | Status: DC | PRN

## 2023-02-16 MED ORDER — SIMETHICONE 80 MG PO CHEW: 80.0000 mg | CHEWABLE_TABLET | ORAL | Status: DC | PRN

## 2023-02-16 MED ORDER — ZOLPIDEM TARTRATE 5 MG PO TABS: 5.0000 mg | ORAL_TABLET | Freq: Every evening | ORAL | Status: DC | PRN

## 2023-02-16 MED ORDER — LACTATED RINGERS IV SOLN: INTRAVENOUS | Status: DC

## 2023-02-16 MED ORDER — LACTATED RINGERS IV SOLN: 500.0000 mL | INTRAVENOUS | Status: DC | PRN

## 2023-02-16 MED ORDER — FENTANYL-BUPIVACAINE-NACL 0.5-0.125-0.9 MG/250ML-% EP SOLN
12.0000 mL/h | EPIDURAL | Status: DC | PRN
  Administered 2023-02-16: 12 mL/h via EPIDURAL
  Filled 2023-02-16: qty 250

## 2023-02-16 MED ORDER — ONDANSETRON HCL 4 MG/2ML IJ SOLN: 4.0000 mg | INTRAMUSCULAR | Status: DC | PRN

## 2023-02-16 MED ORDER — LIDOCAINE HCL (PF) 1 % IJ SOLN: 30.0000 mL | INTRAMUSCULAR | Status: DC | PRN

## 2023-02-16 MED ORDER — TETANUS-DIPHTH-ACELL PERTUSSIS 5-2.5-18.5 LF-MCG/0.5 IM SUSY: 0.5000 mL | PREFILLED_SYRINGE | Freq: Once | INTRAMUSCULAR | Status: DC

## 2023-02-16 MED ORDER — LIDOCAINE HCL (PF) 1 % IJ SOLN
INTRAMUSCULAR | Status: DC | PRN
  Administered 2023-02-16: 11 mL via EPIDURAL

## 2023-02-16 MED ORDER — PHENYLEPHRINE 80 MCG/ML (10ML) SYRINGE FOR IV PUSH (FOR BLOOD PRESSURE SUPPORT): 80.0000 ug | PREFILLED_SYRINGE | INTRAVENOUS | Status: DC | PRN

## 2023-02-16 MED ORDER — OXYTOCIN-SODIUM CHLORIDE 30-0.9 UT/500ML-% IV SOLN
2.5000 [IU]/h | INTRAVENOUS | Status: DC
  Filled 2023-02-16: qty 500

## 2023-02-16 MED ORDER — SENNOSIDES-DOCUSATE SODIUM 8.6-50 MG PO TABS
2.0000 | ORAL_TABLET | Freq: Every day | ORAL | Status: DC
  Administered 2023-02-17 – 2023-02-18 (×2): 2 via ORAL
  Filled 2023-02-16 (×2): qty 2

## 2023-02-16 MED ORDER — PRENATAL MULTIVITAMIN CH
1.0000 | ORAL_TABLET | Freq: Every day | ORAL | Status: DC
  Administered 2023-02-16 – 2023-02-17 (×2): 1 via ORAL
  Filled 2023-02-16 (×3): qty 1

## 2023-02-16 MED ORDER — DIPHENHYDRAMINE HCL 25 MG PO CAPS: 25.0000 mg | ORAL_CAPSULE | Freq: Four times a day (QID) | ORAL | Status: DC | PRN

## 2023-02-16 MED ORDER — DIBUCAINE (PERIANAL) 1 % EX OINT: 1.0000 | TOPICAL_OINTMENT | CUTANEOUS | Status: DC | PRN

## 2023-02-16 MED ORDER — LACTATED RINGERS IV SOLN
500.0000 mL | Freq: Once | INTRAVENOUS | Status: AC
  Administered 2023-02-16: 500 mL via INTRAVENOUS

## 2023-02-16 MED ORDER — DIPHENHYDRAMINE HCL 50 MG/ML IJ SOLN: 12.5000 mg | INTRAMUSCULAR | Status: DC | PRN

## 2023-02-16 MED ORDER — WITCH HAZEL-GLYCERIN EX PADS: 1.0000 | MEDICATED_PAD | CUTANEOUS | Status: DC | PRN

## 2023-02-16 MED ORDER — OXYTOCIN BOLUS FROM INFUSION
333.0000 mL | Freq: Once | INTRAVENOUS | Status: AC
  Administered 2023-02-16: 333 mL via INTRAVENOUS

## 2023-02-16 MED ORDER — ONDANSETRON HCL 4 MG/2ML IJ SOLN: 4.0000 mg | Freq: Four times a day (QID) | INTRAMUSCULAR | Status: DC | PRN

## 2023-02-16 MED ORDER — COCONUT OIL OIL
1.0000 | TOPICAL_OIL | Status: DC | PRN
  Administered 2023-02-18: 1 via TOPICAL

## 2023-02-16 MED ORDER — OXYCODONE-ACETAMINOPHEN 5-325 MG PO TABS: 1.0000 | ORAL_TABLET | ORAL | Status: DC | PRN

## 2023-02-16 NOTE — MAU Provider Note (Signed)
Per RN: Ms. Tracy Browning is a 27 y.o. G1P0 at [redacted]w[redacted]d  who presents to MAU today complaining of regular contractions since 1500. No VB or LOF. +FM.   O: BP 120/73   Pulse 79   Temp 98.9 F (37.2 C)   Resp 16   LMP 05/05/2022   SpO2 100%   Cervical exam:  Dilation: 3.5 Effacement (%): 90 Station: -2 Exam by:: Zenia Resides, RN  Fetal Monitoring: Baseline: 130 Variability: mod Accelerations: + Decelerations: no Contractions: 2-4  MDM: cervical change after recheck, plan for admit.   A: SIUP at [redacted]w[redacted]d  Reactive NST Active labor  P: Admit to LD Mngt per [facility] provider  Donette Larry, CNM 02/16/2023 1:11 AM

## 2023-02-16 NOTE — Anesthesia Postprocedure Evaluation (Signed)
Anesthesia Post Note  Patient: Tracy Browning  Procedure(s) Performed: AN AD HOC LABOR EPIDURAL     Patient location during evaluation: Mother Baby Anesthesia Type: Epidural Level of consciousness: awake and alert Pain management: pain level controlled Vital Signs Assessment: post-procedure vital signs reviewed and stable Respiratory status: spontaneous breathing, nonlabored ventilation and respiratory function stable Cardiovascular status: stable Postop Assessment: no headache, no backache and epidural receding Anesthetic complications: no   No notable events documented.  Last Vitals:  Vitals:   02/16/23 1015 02/16/23 1405  BP: 111/75 108/67  Pulse: 63 81  Resp: 16 16  Temp: 36.6 C 36.7 C  SpO2:      Last Pain:  Vitals:   02/16/23 1405  TempSrc: Oral  PainSc:    Pain Goal:                   Edithe Dobbin

## 2023-02-16 NOTE — Lactation Note (Signed)
This note was copied from a baby's chart. Lactation Consultation Note  Patient Name: Girl Tecora Eustache [id number] Date: 02/16/2023 Age:28 hours Reason for consult: Initial assessment LC reviewed and updated the doc flow sheets with parents . HNS  Baby woke up while LC in the room to a large wet diaper and LC changed it.  LC offered to assist to latch on the right breast / cross cradle, baby latched with depth , few swallows, released and back to sleep STS .  LC reviewed 24 hours feeding goals - feed with feeding cues, and by 3 hours if the baby isn't showing feeding cues , check diaper, change if needed and offer the breast. ( 8-12 times a day )    Maternal Data    Feeding Mother's Current Feeding Choice: Breast Milk  LATCH Score Latch: Grasps breast easily, tongue down, lips flanged, rhythmical sucking.  Audible Swallowing: A few with stimulation  Type of Nipple: Everted at rest and after stimulation  Comfort (Breast/Nipple): Soft / non-tender  Hold (Positioning): Assistance needed to correctly position infant at breast and maintain latch.  LATCH Score: 8   Interventions Interventions: Breast feeding basics reviewed;Assisted with latch;Skin to skin;Breast compression;Adjust position;Support pillows;Position options;Education;LC Services brochure  Discharge    Consult Status Consult Status: Follow-up Date: 02/16/23 Follow-up type: In-patient    Matilde Sprang Zyrah Wiswell 02/16/2023, 2:44 PM

## 2023-02-16 NOTE — Anesthesia Procedure Notes (Signed)
Epidural Patient location during procedure: OB Start time: 02/16/2023 3:25 AM End time: 02/16/2023 3:42 AM  Staffing Anesthesiologist: Lowella Curb, MD Performed: anesthesiologist   Preanesthetic Checklist Completed: patient identified, IV checked, site marked, risks and benefits discussed, surgical consent, monitors and equipment checked, pre-op evaluation and timeout performed  Epidural Patient position: sitting Prep: ChloraPrep Patient monitoring: heart rate, cardiac monitor, continuous pulse ox and blood pressure Approach: midline Location: L2-L3 Injection technique: LOR saline  Needle:  Needle type: Tuohy  Needle gauge: 17 G Needle length: 9 cm Needle insertion depth: 6 cm Catheter type: closed end flexible Catheter size: 20 Guage Catheter at skin depth: 10 cm Test dose: negative  Assessment Events: blood not aspirated, injection not painful, no injection resistance, no paresthesia and negative IV test  Additional Notes Reason for block:procedure for pain

## 2023-02-16 NOTE — H&P (Signed)
Tracy Browning is a 27 y.o. female, G1P0000, IUP at 39.4 weeks, presenting for spontaneous latent labor. Pt endorse + Fm. Denies vaginal leakage. Denies vaginal bleeding.  Prenatal care with [facility] Physician DR Richardson Dopp.  Anemia in first trimester on PO iron.   History of present pregnancy: Pt Info/Preference:  Screening/Consents:     EDD: Estimated Date of Delivery: 02/19/23  Establised: Patient's last menstrual period was 05/05/2022.  Anatomy Scan: Date: ??? Placenta Location: ???    Office: [facility]            Md: DR Richardson Dopp First PNV: 9.5 weeks    Language: english Last PNV: ???    Flu Vaccine:  UTD      TDaP vaccine UTD      Feeding Plan: breast BTL: no    Contraception: ??? VBAC: no    Circumcision: ???      Pediatrician:  ???      Prenatal Classes: no Additional [place]: ???             MFM Referral/Consult:     Support Person: partner      Pain Management: Plan natural Neonatologist Referral:     Birth Plan: [facility]        Labs:   Genetic Screen: Panoroma:Declined AFP:  First Tri: Quad:  Blood Type --/--/A POS (04/23 0230)  Rhogam    Antibody NEG (04/23 0230)  GTT: Early: ??? Third Trimester: Passed  Rubella: Immune (09/11 0000)  RPR: Nonreactive (01/23 0000)   HBsAg: Negative (09/11 0000)  HIV: Non-reactive (09/11 0000)   GBS:  (For PCN allergy, check sensitivities)   Chlamydia: neg  GC: neg  PAP: abnormal  Hgb Electrophoresis:  ???  Hgb NOB: 9.8    28W: ??? Current 12.3    Patient Active Problem List   Diagnosis Date Noted   Normal labor 02/16/2023     Active Ambulatory Problems    Diagnosis Date Noted   No Active Ambulatory Problems   Resolved Ambulatory Problems    Diagnosis Date Noted   No Resolved Ambulatory Problems   Past Medical History:  Diagnosis Date   Medical history non-contributory       Medications Prior to Admission  Medication Sig Dispense Refill Last Dose   Ferrous Sulfate (IRON PO) Take by mouth daily.   02/15/2023   Prenatal Vit-Fe  Fumarate-FA (PRENATAL PO) Take by mouth daily.   02/15/2023    Past Medical History:  Diagnosis Date   Medical history non-contributory      No current facility-administered medications on file prior to encounter.   Current Outpatient Medications on File Prior to Encounter  Medication Sig Dispense Refill   Ferrous Sulfate (IRON PO) Take by mouth daily.     Prenatal Vit-Fe Fumarate-FA (PRENATAL PO) Take by mouth daily.       No Known Allergies    OB History     Gravida  1   Para      Term      Preterm      AB      Living         SAB      IAB      Ectopic      Multiple      Live Births             Past Medical History:  Diagnosis Date   Medical history non-contributory    Past Surgical History:  Procedure Laterality Date   NO PAST  SURGERIES     Family History: family history includes Healthy in her father and mother. Social History:  reports that she has never smoked. She has never used smokeless tobacco. She reports that she does not currently use alcohol. She reports that she does not currently use drugs after having used the following drugs: Marijuana.   Prenatal Transfer Tool  Maternal Diabetes: No Genetic Screening: Declined Maternal Ultrasounds/Referrals: Normal Fetal Ultrasounds or other Referrals:  None Maternal Substance Abuse:  No Significant Maternal Medications:  None Significant Maternal Lab Results: Group B Strep negative  ROS:  Review of Systems  Constitutional: Negative.   HENT: Negative.    Eyes: Negative.   Respiratory: Negative.    Cardiovascular: Negative.   Gastrointestinal: Negative.   Genitourinary: Negative.   Musculoskeletal: Negative.   Skin: Negative.   Neurological: Negative.   Endo/Heme/Allergies: Negative.   Psychiatric/Behavioral: Negative.       Physical Exam: BP (!) 110/58   Pulse 90   Temp 97.8 F (36.6 C) (Oral)   Resp 17   LMP 05/05/2022   SpO2 100%   Physical Exam Vitals and nursing note  reviewed.  Constitutional:      Appearance: Normal appearance.  HENT:     Head: Normocephalic and atraumatic.     Nose: Nose normal.     Mouth/Throat:     Mouth: Mucous membranes are moist.  Eyes:     Conjunctiva/sclera: Conjunctivae normal.  Cardiovascular:     Rate and Rhythm: Normal rate and regular rhythm.     Pulses: Normal pulses.     Heart sounds: Normal heart sounds.  Pulmonary:     Effort: Pulmonary effort is normal.     Breath sounds: Normal breath sounds.  Abdominal:     General: Bowel sounds are normal.  Genitourinary:    Comments: Uterus gravida, pelvis adequate.  Musculoskeletal:        General: Normal range of motion.     Cervical back: Normal range of motion and neck supple.  Skin:    General: Skin is warm.     Capillary Refill: Capillary refill takes less than 2 seconds.  Neurological:     General: No focal deficit present.     Mental Status: She is alert.  Psychiatric:        Mood and Affect: Mood normal.      NST: FHR baseline 125 bpm, Variability: moderate, Accelerations:present, Decelerations:  Absent= Cat 1/Reactive UC:   irregular, every 2-4 minutes SVE:   Dilation: 8 Effacement (%): 90 Station:  (BBOW) Exam by:: Smalley RN, vertex verified by fetal sutures.  Leopold's: Position vertex, EFW 7.5lbs via leopold's.   Labs: Results for orders placed or performed during the hospital encounter of 02/15/23 (from the past 24 hour(s))  OB RESULTS CONSOLE ABO/Rh     Status: None   Collection Time: 02/16/23 12:00 AM  Result Value Ref Range   RH Type  Positive    ABO Grouping A   Type and screen [facility]     Status: None   Collection Time: 02/16/23  2:30 AM  Result Value Ref Range   ABO/RH(D) A POS    Antibody Screen NEG    Sample Expiration      02/19/2023,2359 Performed at [facility], [address], [place], [place] [address]   CBC     Status: Abnormal   Collection Time: 02/16/23  2:32 AM  Result Value Ref Range    WBC 8.1 4.0 -  10.5 K/uL   RBC 4.05 3.87 - 5.11 MIL/uL   Hemoglobin 12.3 12.0 - 15.0 g/dL   HCT 40.9 (L) 81.1 - 91.4 %   MCV 87.9 80.0 - 100.0 fL   MCH 30.4 26.0 - 34.0 pg   MCHC 34.6 30.0 - 36.0 g/dL   RDW 78.2 (H) 95.6 - 21.3 %   Platelets 205 150 - 400 K/uL   nRBC 0.0 0.0 - 0.2 %    Imaging:  ECHOCARDIOGRAM COMPLETE  Result Date: 02/09/2023    ECHOCARDIOGRAM REPORT   Patient Name:   CHESNEE FLOREN   Date of Exam: 02/09/2023 Medical Rec #:  [id number]     Height:       62.0 in Accession #:    [id number]    Weight:       141.8 lb Date of Birth:  12/16/1995    BSA:          1.652 m Patient Age:    26 years      BP:           104/70 mmHg Patient Gender: F             HR:           78 bpm. Exam Location:  [address] Procedure: 2D Echo, 3D Echo, Cardiac Doppler, Color Doppler and Strain Analysis Indications:    R06.09 Dyspnea  History:        Patient has no prior history of Echocardiogram examinations.                 Risk Factors:[redacted] weeks pregnant.  Sonographer:    Daphine Deutscher RDCS Referring Phys: [id number] KARDIE TOBB IMPRESSIONS  1. Left ventricular ejection fraction, by estimation, is 60 to 65%. Left ventricular ejection fraction by 3D volume is 61 %. The left ventricle has normal function. The left ventricle has no regional wall motion abnormalities. Left ventricular diastolic  parameters were normal. The average left ventricular global longitudinal strain is -25.8 %. The global longitudinal strain is normal.  2. Normal right ventricular free wall strain -30.8%.. Right ventricular systolic function is normal. The right ventricular size is normal. Tricuspid regurgitation signal is inadequate for assessing PA pressure.  3. A small pericardial effusion is present. The pericardial effusion is localized near the right atrium and anterior to the right ventricle. There is no evidence of cardiac tamponade.  4. The mitral valve is normal in structure. Trivial mitral valve regurgitation. No evidence of  mitral stenosis.  5. The aortic valve is tricuspid. Aortic valve regurgitation is not visualized. No aortic stenosis is present.  6. The inferior vena cava is normal in size with greater than 50% respiratory variability, suggesting right atrial pressure of 3 mmHg. FINDINGS  Left Ventricle: Left ventricular ejection fraction, by estimation, is 60 to 65%. Left ventricular ejection fraction by 3D volume is 61 %. The left ventricle has normal function. The left ventricle has no regional wall motion abnormalities. The average left ventricular global longitudinal strain is -25.8 %. The global longitudinal strain is normal. The left ventricular internal cavity size was normal in size. There is no left ventricular hypertrophy. Left ventricular diastolic parameters were normal. Right Ventricle: Normal right ventricular free wall strain -30.8%. The right ventricular size is normal. No increase in right ventricular wall thickness. Right ventricular systolic function is normal. Tricuspid regurgitation signal is inadequate for assessing PA pressure. Left Atrium: Left atrial size was normal in size. Right Atrium: Right atrial size was normal in  size. Pericardium: A small pericardial effusion is present. The pericardial effusion is localized near the right atrium and anterior to the right ventricle. There is no evidence of cardiac tamponade. Mitral Valve: The mitral valve is normal in structure. Trivial mitral valve regurgitation. No evidence of mitral valve stenosis. Tricuspid Valve: The tricuspid valve is normal in structure. Tricuspid valve regurgitation is trivial. No evidence of tricuspid stenosis. Aortic Valve: The aortic valve is tricuspid. Aortic valve regurgitation is not visualized. No aortic stenosis is present. Pulmonic Valve: The pulmonic valve was normal in structure. Pulmonic valve regurgitation is trivial. No evidence of pulmonic stenosis. Aorta: The aortic root is normal in size and structure. Venous: The inferior  vena cava is normal in size with greater than 50% respiratory variability, suggesting right atrial pressure of 3 mmHg. IAS/Shunts: No atrial level shunt detected by color flow Doppler.  LEFT VENTRICLE PLAX 2D LVIDd:         3.90 cm         Diastology LVIDs:         2.50 cm         LV e' medial:    11.10 cm/s LV PW:         0.90 cm         LV E/e' medial:  9.3 LV IVS:        0.90 cm         LV e' lateral:   17.30 cm/s LVOT diam:     1.90 cm         LV E/e' lateral: 6.0 LV SV:         38 LV SV Index:   23              2D LVOT Area:     2.84 cm        Longitudinal                                Strain                                2D Strain GLS  -25.2 %                                (A2C):                                2D Strain GLS  -26.0 %                                (A3C):                                2D Strain GLS  -26.2 %                                (A4C):                                2D Strain GLS  -25.8 %  Avg:                                 3D Volume EF                                LV 3D EF:    Left                                             ventricul                                             ar                                             ejection                                             fraction                                             by 3D                                             volume is                                             61 %.                                 3D Volume EF:                                3D EF:        61 %                                LV EDV:       131 ml                                LV ESV:       51 ml                                LV SV:        80 ml RIGHT VENTRICLE  IVC RV Basal diam:  3.20 cm     IVC diam: 0.80 cm RV S prime:     16.00 cm/s TAPSE (M-mode): 2.3 cm LEFT ATRIUM             Index        RIGHT ATRIUM          Index LA diam:        3.20 cm 1.94 cm/m   RA Area:     9.16 cm LA Vol (A2C):    24.2 ml 14.65 ml/m  RA Volume:   18.40 ml 11.14 ml/m LA Vol (A4C):   31.3 ml 18.95 ml/m LA Biplane Vol: 29.1 ml 17.62 ml/m  AORTIC VALVE LVOT Vmax:   77.45 cm/s LVOT Vmean:  52.850 cm/s LVOT VTI:    0.133 m  AORTA Ao Root diam: 2.90 cm Ao Asc diam:  2.80 cm MITRAL VALVE MV Area (PHT): 3.88 cm     SHUNTS MV Decel Time: 196 msec     Systemic VTI:  0.13 m MV E velocity: 103.30 cm/s  Systemic Diam: 1.90 cm MV A velocity: 49.80 cm/s MV E/A ratio:  2.07 Weston Brass MD Electronically signed by Weston Brass MD Signature Date/Time: 02/09/2023/8:39:37 PM    Final     MAU Course: Orders Placed This Encounter  Procedures   CBC   RPR   OB RESULTS CONSOLE GC/Chlamydia   OB RESULTS CONSOLE RPR   OB RESULTS CONSOLE HIV antibody   OB RESULTS CONSOLE Rubella Antibody   OB RESULTS CONSOLE Hepatitis B surface antigen   Hepatitis C antibody   Diet clear liquid Room service appropriate? Yes; Fluid consistency: Thin   Vitals signs per unit policy   Notify physician (specify)   Fetal monitoring per unit policy   Activity as tolerated   Cervical Exam   Measure blood pressure post delivery every 15 min x 1 hour then every 30 min x 1 hour   Fundal check post delivery every 15 min x 1 hour then every 30 min x 1 hour   Apply Labor & Delivery Care Plan   Patient may have epidural placement upon request   If Rapid HIV test positive or known HIV positive: initiate AZT orders   May in and out cath x 2 for inability to void   Insert urethral catheter X 1 PRN If Coude Catheter is chosen, qualified resources by campus can be found in the clinical skills nursing procedure for Coude Catheter 1. If straight catheterized > 2 times or patient unable to void post epidural plac...   Refer to Sidebar Report Urinary (Foley) Catheter Indications   Refer to Sidebar Report Post Indwelling Urinary Catheter Removal and Intervention Guidelines   Discontinue foley prior to vaginal delivery   Initiate Oral Care Protocol    Initiate Carrier Fluid Protocol   May use local infiltration of 1% lidocaine plain to produce a skin wheal prior to IV insertion   Notify in-house Anesthesia team of nausea and vomiting greater than 5 hours   Assess for signs/symptoms of PIH/preeclampsia   RN to place order for: CBC if one has not been drawn in the past 6 hours for all patients with hypertensive disease, pre-eclampsia, eclampsia, thrombocytopenia or previous PLTC<150,000.   Identify to Anesthesia if patient plans to have postpartum tubal ligation; do not remove epidural without discussion with Anesthesiologist   Vital signs following Epidural Placement, re-bolus or re-dose monitor patient's BP and oxygen saturation every 5 minutes for 30  minutes   Pain Assessment Document numeric pain score   RN to remain at bedside continuously for 30 minutes post epidural placement, post re-bolus / re-dose   Do not administer IV opioids while epidural is in place.   Do not adjust epidural rate or discontinue epidural without notifying the anesthesiologist.   Notify Anesthesia if the patient becomes short of breath or complains of heaviness in chest, chest pain, and/or unrelieved pain   Notify Anesthesia prior to discontinuing epidural infusion   Full code   Type and screen  [facility]   OB RESULTS CONSOLE ABO/Rh   Insert and maintain IV Line   Admit to Inpatient (patient's expected length of stay will be greater than 2 midnights or inpatient only procedure)   Meds ordered this encounter  Medications   lactated ringers infusion   oxytocin (PITOCIN) IV BOLUS FROM BAG   oxytocin (PITOCIN) IV infusion 30 units in NS 500 mL - Premix   lactated ringers infusion 500-1,000 mL   acetaminophen (TYLENOL) tablet 650 mg   oxyCODONE-acetaminophen (PERCOCET/ROXICET) 5-325 MG per tablet 1 tablet   oxyCODONE-acetaminophen (PERCOCET/ROXICET) 5-325 MG per tablet 2 tablet   sodium phosphate (FLEET) 7-19 GM/118ML enema 1 enema    ondansetron (ZOFRAN) injection 4 mg   sodium citrate-citric acid (ORACIT) solution 30 mL   lidocaine (PF) (XYLOCAINE) 1 % injection 30 mL   fentaNYL (SUBLIMAZE) injection 50-100 mcg   ePHEDrine injection 10 mg   PHENYLephrine 80 mcg/ml in normal saline Adult IV Push Syringe (For Blood Pressure Support)   lactated ringers infusion 500 mL   fentaNYL 2 mcg/mL w/ bupivacaine 0.125% in NS 250 mL epidural infusion   diphenhydrAMINE (BENADRYL) injection 12.5 mg   ePHEDrine injection 10 mg   PHENYLephrine 80 mcg/ml in normal saline Adult IV Push Syringe (For Blood Pressure Support)    Assessment/Plan: Alizah Sills is a 27 y.o. female, G1P0000, IUP at 39.4 weeks, presenting for spontaneous latent labor. Pt endorse + Fm. Denies vaginal leakage. Denies vaginal bleeding.  FWB: Cat 1 Fetal Tracing.   Plan: Admit to [facility] Suite Routine [facility] orders Pain med/epidural prn Expectant mangement Anticipate labor progression   [facility], FNP-C, PMHNP-BC  [address]  [place], [place] [address]  Cell: [phone]  Office Phone: [phone] Fax: [phone] 02/16/2023  5:18 AM

## 2023-02-16 NOTE — Anesthesia Preprocedure Evaluation (Signed)
Anesthesia Evaluation  Patient identified by MRN, date of birth, ID band Patient awake    Reviewed: Allergy & Precautions, H&P , NPO status , Patient's Chart, lab work & pertinent test results  Airway Mallampati: II  TM Distance: >3 FB Neck ROM: Full    Dental no notable dental hx.    Pulmonary neg pulmonary ROS   Pulmonary exam normal breath sounds clear to auscultation       Cardiovascular negative cardio ROS Normal cardiovascular exam Rhythm:Regular Rate:Normal     Neuro/Psych negative neurological ROS  negative psych ROS   GI/Hepatic negative GI ROS, Neg liver ROS,,,  Endo/Other  negative endocrine ROS    Renal/GU negative Renal ROS  negative genitourinary   Musculoskeletal negative musculoskeletal ROS (+)    Abdominal   Peds negative pediatric ROS (+)  Hematology negative hematology ROS (+)   Anesthesia Other Findings   Reproductive/Obstetrics (+) Pregnancy                             Anesthesia Physical Anesthesia Plan  ASA: 2  Anesthesia Plan: Epidural   Post-op Pain Management:    Induction:   PONV Risk Score and Plan:   Airway Management Planned:   Additional Equipment:   Intra-op Plan:   Post-operative Plan:   Informed Consent:   Plan Discussed with:   Anesthesia Plan Comments:        Anesthesia Quick Evaluation  

## 2023-02-17 ENCOUNTER — Encounter (HOSPITAL_COMMUNITY): Payer: Self-pay | Admitting: Obstetrics and Gynecology

## 2023-02-17 LAB — CBC
HCT: 33.2 % — ABNORMAL LOW (ref 36.0–46.0)
Hemoglobin: 11.9 g/dL — ABNORMAL LOW (ref 12.0–15.0)
MCH: 30.9 pg (ref 26.0–34.0)
MCHC: 35.8 g/dL (ref 30.0–36.0)
MCV: 86.2 fL (ref 80.0–100.0)
Platelets: 185 10*3/uL (ref 150–400)
RBC: 3.85 MIL/uL — ABNORMAL LOW (ref 3.87–5.11)
RDW: 15.9 % — ABNORMAL HIGH (ref 11.5–15.5)
WBC: 10.3 10*3/uL (ref 4.0–10.5)
nRBC: 0 % (ref 0.0–0.2)

## 2023-02-17 NOTE — Social Work (Signed)
CSW acknowledges consult for drug exposed newborn. however consult screened out due to chart review, THC hx was prior to pregnancy, no indication MOB used substances during pregnancy. CSW discussed with MOB's RN and was notified there was no indication MOB used substances and urine was not collected.   Please consult CSW if current concerns arise or by MOB's request.  CSW will monitor CDS results and make report to Child Protective Services if warranted.  Wende Neighbors, LCSWA Clinical Social Worker [phone]

## 2023-02-17 NOTE — Progress Notes (Signed)
Post Partum Day 1 Subjective: no complaints, up ad lib, voiding, tolerating PO, and + flatus  Objective: Blood pressure 103/71, pulse (!) 57, temperature 98.2 F (36.8 C), temperature source Oral, resp. rate 18, last menstrual period 05/05/2022, SpO2 100 %.  Physical Exam:  General: alert, cooperative, and no distress Lochia: appropriate Uterine Fundus: firm Incision: NA DVT Evaluation: No evidence of DVT seen on physical exam.  Recent Labs    02/16/23 0232 02/17/23 0615  HGB 12.3 11.9*  HCT 35.6* 33.2*    Assessment/Plan: Plan for discharge tomorrow, Breastfeeding, and Lactation consult Routine postpartum care    LOS: 1 day   Gerald Leitz, MD 02/17/2023, 10:10 AM

## 2023-02-17 NOTE — Lactation Note (Signed)
This note was copied from a baby's chart. Lactation Consultation Note  Patient Name: Tracy Browning [id number] Date: 02/17/2023 Age:28 hours Per MBU nurse and mom requested early D/C .  Reason for consult: Follow-up assessment;Primapara;1st time breastfeeding;Term;Infant weight loss;Breastfeeding assistance As LC entered the room , mom holding the baby and baby sucking on the pacifier and fussy.  LC recommended to see if the baby would fed . LC reviewed the reasons for holding off on a pacifier for 3 weeks to allow baby to establish latching to mom well.  LC offered to assist to latch. Mom tried the cross cradle 1st and baby would not stayed latched. LC recommended switching to the football position, at 1st baby was on and off , LC assisted and showed dad how he could help mom to obtain depth with the latch.  Baby finally latched with and obtained depth without pulling back away from the breast. Baby fed 13 mins with increased swallows and released. Nipple appeared well rounded after baby released.  Latch score - 8  LC reviewed BF D/C teaching and the [facility] resources.  Per mom feels more confident after the [facility] consult.   Maternal Data Has patient been taught Hand Expression?: Yes Does the patient have breastfeeding experience prior to this delivery?: No  Feeding Mother's Current Feeding Choice: Breast Milk  LATCH Score Latch: Repeated attempts needed to sustain latch, nipple held in mouth throughout feeding, stimulation needed to elicit sucking reflex.  Audible Swallowing: Spontaneous and intermittent  Type of Nipple: Everted at rest and after stimulation  Comfort (Breast/Nipple): Soft / non-tender  Hold (Positioning): Assistance needed to correctly position infant at breast and maintain latch.  LATCH Score: 8   Lactation Tools Discussed/Used  Hand pump   Interventions Interventions: Breast feeding basics reviewed;Assisted with latch;Skin to skin;Breast massage;Hand  express;Pre-pump if needed;Breast compression;Adjust position;Support pillows;Position options;Hand pump;Education;LC Services brochure  Discharge Discharge Education: Engorgement and breast care;Warning signs for feeding baby Pump: Manual;DEBP;Personal WIC Program: No  Consult Status Consult Status: Complete Date: 02/17/23    Kathrin Greathouse 02/17/2023, 11:14 AM

## 2023-02-18 MED ORDER — ACETAMINOPHEN 325 MG PO TABS: 650.0000 mg | ORAL_TABLET | ORAL | Status: AC | PRN

## 2023-02-18 MED ORDER — IBUPROFEN 600 MG PO TABS: 600.0000 mg | ORAL_TABLET | Freq: Four times a day (QID) | ORAL | 0 refills | Status: AC | PRN

## 2023-02-18 NOTE — Discharge Summary (Signed)
Postpartum Discharge Summary  Date of Service updated 02/18/2023     Patient Name: Tracy Browning DOB: Jun 11, 1996 MRN: [id number]  Date of admission: 02/15/2023 Delivery date:02/16/2023  Delivering provider: [place]  Date of discharge: 02/18/2023  Admitting diagnosis: Normal labor [O80, Z37.9] Intrauterine pregnancy: [redacted]w[redacted]d     Secondary diagnosis:  Principal Problem:   Normal labor Active Problems:   SVD (spontaneous vaginal delivery)   Normal postpartum course  Additional problems: None    Discharge diagnosis: Term Pregnancy Delivered                                              Post partum procedures: None Augmentation: N/A Complications: None  Hospital course: Onset of Labor With Vaginal Delivery      27 y.o. yo G1P0 at [redacted]w[redacted]d was admitted in Active Labor on 02/15/2023. Labor course was uncomplicated. Membrane Rupture Time/Date: 6:10 AM ,02/16/2023   Delivery Method:Vaginal, Spontaneous  Episiotomy: None  Lacerations:  None  Patient had a postpartum course uncomplicated .  She is ambulating, tolerating a regular diet, passing flatus, and urinating well. Patient is discharged home in stable condition on 02/18/23.  Newborn Data: Birth date:02/16/2023  Birth time:6:15 AM  Gender:Female  Living status:Living  Apgars:9 ,9  Weight:3120 g   Magnesium Sulfate received: No BMZ received: No Rhophylac:N/A MMR:N/A T-DaP:Given prenatally Flu: N/A Transfusion:No  Physical exam  Vitals:   02/17/23 1249 02/17/23 1500 02/17/23 1958 02/18/23 0520  BP: 104/75  103/69 102/61  Pulse: 71  80 70  Resp: Temp: 97.8 F (36.6 C)  98.3 F (36.8 C) 98 F (36.7 C)  TempSrc: Oral  Oral Oral  SpO2: 96%   100%  Weight:  68.6 kg    Height:   (1.575 m)     General: alert, cooperative, and no distress Lochia: appropriate Uterine Fundus: firm Incision: N/A DVT Evaluation: No evidence of DVT seen on physical exam. Labs: Lab Results  Component Value Date   WBC  10.3 02/17/2023   HGB 11.9 (L) 02/17/2023   HCT 33.2 (L) 02/17/2023   MCV 86.2 02/17/2023   PLT 185 02/17/2023       No data to display         [place] Score:    02/17/2023    2:59 PM  Edinburgh Postnatal Depression Scale Screening Tool  I have been able to laugh and see the funny side of things. 0  I have looked forward with enjoyment to things. 0  I have blamed myself unnecessarily when things went wrong. 1  I have been anxious or worried for no good reason. 0  I have felt scared or panicky for no good reason. 0  Things have been getting on top of me. 0  I have been so unhappy that I have had difficulty sleeping. 0  I have felt sad or miserable. 0  I have been so unhappy that I have been crying. 0  The thought of harming myself has occurred to me. 0  Edinburgh Postnatal Depression Scale Total 1      After visit meds:  Allergies as of 02/18/2023   No Known Allergies      Medication List     STOP taking these medications    IRON PO       TAKE these medications  acetaminophen 325 MG tablet Commonly known as: Tylenol Take 2 tablets (650 mg total) by mouth every 4 (four) hours as needed (for pain scale < 4).   ibuprofen 600 MG tablet Commonly known as: ADVIL Take 1 tablet (600 mg total) by mouth every 6 (six) hours as needed.   PRENATAL PO Take 1 tablet by mouth daily.         Discharge home in stable condition Infant Feeding: Breast Infant Disposition:home with mother Discharge instruction: per After Visit Summary and Postpartum booklet. Activity: Advance as tolerated. Pelvic rest for 6 weeks.  Diet: routine diet Anticipated Birth Control: Unsure Postpartum Appointment:6 weeks Additional Postpartum F/U:  None Future Appointments: Future Appointments  Date Time Provider Department Center  03/09/2023  9:20 AM Tobb, [place]ona [place], DO [facility] None   Follow up Visit:  Follow-up Information     Gerald Leitz, MD. Schedule an appointment as soon  as possible for a visit in 6 week(s).   Specialty: Obstetrics and Gynecology Why: please call the office to schedule a postpartum visit in 6 weeks Contact information: [address] [place] [address] [phone]                     02/18/2023 Gerald Leitz, MD

## 2023-02-18 NOTE — Lactation Note (Signed)
This note was copied from a baby's chart. Lactation Consultation Note  Patient Name: Girl Zamari Bonsall [id number] Date: 02/18/2023 Age:28 hours - 9% weight loss -  Reason for consult: Follow-up assessment;Primapara;1st time breastfeeding;Infant weight loss;Term (9 % weight loss , milk is coming in. per mom baby recently fed) LC reviewed doc flow sheets , correlate with the doc flow sheets .  Repeat weight check at 3pm and Bili check.  Bilirubin elevated.   Maternal Data Has patient been taught Hand Expression?: Yes  Feeding Mother's Current Feeding Choice: Breast Milk  LATCH Score    Lactation Tools Discussed/Used Tools: Pump;Flanges Flange Size: 21;24 Breast pump type: Manual Pump Education: Milk Storage;Setup, frequency, and cleaning  Interventions Interventions: Breast feeding basics reviewed;Hand pump;Education;LC Services brochure  Discharge Discharge Education: Engorgement and breast care;Warning signs for feeding baby Pump: Personal;Manual;Hands Free;DEBP  Consult Status Consult Status: Complete Date: 02/18/23    Kathrin Greathouse 02/18/2023, 11:23 AM

## 2023-02-19 ENCOUNTER — Inpatient Hospital Stay (HOSPITAL_COMMUNITY)
Admission: AD | Admit: 2023-02-19 | Payer: BC Managed Care – PPO | Source: Home / Self Care | Admitting: Obstetrics and Gynecology

## 2023-02-25 ENCOUNTER — Telehealth (HOSPITAL_COMMUNITY): Payer: Self-pay

## 2023-02-25 NOTE — Telephone Encounter (Signed)
Patient did not answer phone call. Voicemail left for patient.   Suann Larry [facility] Per[facility]atal Services   02/25/23,1848

## 2023-03-09 ENCOUNTER — Ambulatory Visit: Payer: BC Managed Care – PPO | Attending: Cardiology | Admitting: Cardiology

## 2023-03-10 ENCOUNTER — Encounter: Payer: Self-pay | Admitting: Cardiology
